# Patient Record
Sex: Male | Born: 1960 | Race: White | Hispanic: No | Marital: Married | Smoking: Never smoker
Health system: Southern US, Community
[De-identification: ages and names within clinical notes are randomized; demographics above are authoritative.]

## PROBLEM LIST (undated history)

## (undated) DIAGNOSIS — K219 Gastro-esophageal reflux disease without esophagitis: Secondary | ICD-10-CM

## (undated) HISTORY — PX: OTHER SURGICAL HISTORY: SHX169

## (undated) HISTORY — PX: HAND SURGERY: SHX662

## (undated) HISTORY — PX: TONSILLECTOMY: SUR1361

---

## 2002-08-04 ENCOUNTER — Ambulatory Visit (HOSPITAL_BASED_OUTPATIENT_CLINIC_OR_DEPARTMENT_OTHER): Admission: RE | Admit: 2002-08-04 | Discharge: 2002-08-04 | Payer: Self-pay | Admitting: Orthopedic Surgery

## 2017-02-13 ENCOUNTER — Emergency Department (HOSPITAL_BASED_OUTPATIENT_CLINIC_OR_DEPARTMENT_OTHER): Payer: Worker's Compensation

## 2017-02-13 ENCOUNTER — Emergency Department (HOSPITAL_BASED_OUTPATIENT_CLINIC_OR_DEPARTMENT_OTHER)
Admission: EM | Admit: 2017-02-13 | Discharge: 2017-02-13 | Disposition: A | Discharge status: Home / Self Care | Payer: Worker's Compensation | Attending: Physician Assistant | Admitting: Physician Assistant

## 2017-02-13 ENCOUNTER — Encounter (HOSPITAL_BASED_OUTPATIENT_CLINIC_OR_DEPARTMENT_OTHER): Payer: Self-pay | Admitting: *Deleted

## 2017-02-13 DIAGNOSIS — S61219A Laceration without foreign body of unspecified finger without damage to nail, initial encounter: Secondary | ICD-10-CM

## 2017-02-13 DIAGNOSIS — S61321A Laceration with foreign body of left index finger with damage to nail, initial encounter: Secondary | ICD-10-CM | POA: Diagnosis not present

## 2017-02-13 DIAGNOSIS — S61213A Laceration without foreign body of left middle finger without damage to nail, initial encounter: Secondary | ICD-10-CM | POA: Diagnosis not present

## 2017-02-13 DIAGNOSIS — S61211A Laceration without foreign body of left index finger without damage to nail, initial encounter: Secondary | ICD-10-CM

## 2017-02-13 DIAGNOSIS — S6992XA Unspecified injury of left wrist, hand and finger(s), initial encounter: Secondary | ICD-10-CM | POA: Diagnosis present

## 2017-02-13 DIAGNOSIS — W268XXA Contact with other sharp object(s), not elsewhere classified, initial encounter: Secondary | ICD-10-CM | POA: Insufficient documentation

## 2017-02-13 DIAGNOSIS — IMO0002 Reserved for concepts with insufficient information to code with codable children: Secondary | ICD-10-CM

## 2017-02-13 DIAGNOSIS — Y929 Unspecified place or not applicable: Secondary | ICD-10-CM | POA: Diagnosis not present

## 2017-02-13 DIAGNOSIS — Y9389 Activity, other specified: Secondary | ICD-10-CM | POA: Diagnosis not present

## 2017-02-13 DIAGNOSIS — Y999 Unspecified external cause status: Secondary | ICD-10-CM | POA: Diagnosis not present

## 2017-02-13 MED ORDER — LIDOCAINE HCL 2 % IJ SOLN
20.0000 mL | Freq: Once | INTRAMUSCULAR | Status: AC
  Administered 2017-02-13: 400 mg
  Filled 2017-02-13: qty 20

## 2017-02-13 NOTE — ED Provider Notes (Signed)
[facility] Provider Note   CSN: [id number] Arrival date & time: 02/13/17  1124     History   Chief Complaint Chief Complaint  Patient presents with  . Finger Injury    HPI Billy Olson is a 56 y.o. male.  HPI Patient presents to the emergency department with laceration to second, third digit on the left hand.  The patient states he was removing a piece of metal flashing when he cut across both fingers.  She states that he is having difficulty with movements of the fingers at this point.  Patient states that he applied pressure to control the bleeding.  He did not take any medications prior to arrival.  Patient denies any weakness, nausea, vomiting, or other injuries History reviewed. No pertinent past medical history.  There are no active problems to display for this patient.   Past Surgical History:  Procedure Laterality Date  . thumb surgery    . TONSILLECTOMY         Home Medications    Prior to Admission medications   Not on File    Family History No family history on file.  Social History Social History  Substance Use Topics  . Smoking status: Never Smoker  . Smokeless tobacco: Never Used  . Alcohol use Yes     Allergies   Patient has no known allergies.   Review of Systems Review of Systems  All other systems negative except as documented in the HPI. All pertinent positives and negatives as reviewed in the HPI. Physical Exam Updated Vital Signs BP 108/72 (BP Location: Right Arm)   Pulse 75   Temp 98.1 F (36.7 C) (Oral)   Resp 18   Ht 6\' 2"  (1.88 m)   Wt 108 kg (238 lb)   SpO2 99%   BMI 30.56 kg/m   Physical Exam  Constitutional: He is oriented to person, place, and time. He appears well-developed and well-nourished. No distress.  HENT:  Head: Normocephalic and atraumatic.  Eyes: Pupils are equal, round, and reactive to light.  Pulmonary/Chest: Effort normal.  Musculoskeletal:       Left hand: He exhibits  laceration. He exhibits normal two-point discrimination, normal capillary refill and no deformity. Normal sensation noted.       Hands: Neurological: He is alert and oriented to person, place, and time.  Skin: Skin is warm and dry.  Psychiatric: He has a normal mood and affect.  Nursing note and vitals reviewed.    ED Treatments / Results  Labs (all labs ordered are listed, but only abnormal results are displayed) Labs Reviewed - No data to display  EKG  EKG Interpretation None       Radiology Dg Hand Complete Left  Result Date: 02/13/2017 CLINICAL DATA:  Laceration of left middle and ring fingers. EXAM: LEFT HAND - COMPLETE 3+ VIEW COMPARISON:  None. FINDINGS: There is no evidence of fracture or dislocation. There is no evidence of arthropathy or other focal bone abnormality. Soft tissues are unremarkable. No radiopaque foreign body is noted. IMPRESSION: Normal left hand. Electronically Signed   By: Lupita RaiderJames  Green Jr, M.D.   On: 02/13/2017 12:23    Procedures Procedures (including critical care time)  Medications Ordered in ED Medications  lidocaine (XYLOCAINE) 2 % (with pres) injection 400 mg (400 mg Infiltration Given 02/13/17 1343)     Initial Impression / Assessment and Plan / ED Course  I have reviewed the triage vital signs and the nursing notes.  Pertinent  labs & imaging results that were available during my care of the patient were reviewed by me and considered in my medical decision making (see chart for details).    I spoke with Dr. Merlyn Lot, of hand surgery, who advised me to loosely approximate the wounds splint and have him follow-up in their office tomorrow.  Patient agrees the plan and all questions were answered.  I advised him  to call his office as soon as he leaves this emergency department, to set up an appointment   LACERATION REPAIR Performed by: Carlyle Dolly Authorized by: Carlyle Dolly Consent: Verbal consent obtained. Risks and  benefits: risks, benefits and alternatives were discussed Consent given by: patient Patient identity confirmed: provided demographic data Prepped and Draped in normal sterile fashion Wound explored  Laceration Location: Second, third digit just above the PIP joints  Laceration Length: Second digit 2 cm third digit 1.5 cm  No Foreign Bodies seen or palpated  Anesthesia: local infiltration  Local anesthetic: lidocaine 2 % without epinephrine  Anesthetic total: 3 ml per wound   Irrigation method: syringe Amount of cleaning: standard  Skin closure: 4-0 Prolene   Number of sutures: 2 sutures in each wound   Technique: Simple interrupted   Patient tolerance: Patient tolerated the procedure well with no immediate complications.  The wound was copiously irrigated  Final Clinical Impressions(s) / ED Diagnoses   Final diagnoses:  Laceration of finger    New Prescriptions New Prescriptions   No medications on file     Charlestine Night, Cordelia Poche 02/14/17 1617    Abelino Derrick, MD 02/15/17 [phone]

## 2017-02-13 NOTE — ED Triage Notes (Signed)
W/C injury. Laceration to his left 2nd and 3rd digits on a piece of metal siding this am. Decreased resistance during neuro fx.

## 2017-02-13 NOTE — ED Notes (Addendum)
W2 lacerationa noted to left hand 1st and 2nd digits. Irrigated and cleaned  - patient tolerated well

## 2017-02-13 NOTE — Discharge Instructions (Signed)
Call the hand surgeon's office for an appointment for tomorrow

## 2017-02-14 ENCOUNTER — Other Ambulatory Visit: Payer: Self-pay | Admitting: Orthopedic Surgery

## 2017-02-14 ENCOUNTER — Ambulatory Visit (HOSPITAL_BASED_OUTPATIENT_CLINIC_OR_DEPARTMENT_OTHER)
Admission: RE | Admit: 2017-02-14 | Discharge: 2017-02-14 | Disposition: A | Discharge status: Home / Self Care | Payer: Worker's Compensation | Source: Ambulatory Visit | Attending: Orthopedic Surgery | Admitting: Orthopedic Surgery

## 2017-02-14 ENCOUNTER — Ambulatory Visit (HOSPITAL_BASED_OUTPATIENT_CLINIC_OR_DEPARTMENT_OTHER): Payer: Worker's Compensation | Admitting: Anesthesiology

## 2017-02-14 ENCOUNTER — Encounter (HOSPITAL_COMMUNITY)
Admission: RE | Disposition: A | Discharge status: Home / Self Care | Payer: Self-pay | Source: Ambulatory Visit | Attending: Orthopedic Surgery

## 2017-02-14 ENCOUNTER — Encounter (HOSPITAL_BASED_OUTPATIENT_CLINIC_OR_DEPARTMENT_OTHER): Payer: Self-pay | Admitting: *Deleted

## 2017-02-14 DIAGNOSIS — Y9389 Activity, other specified: Secondary | ICD-10-CM | POA: Insufficient documentation

## 2017-02-14 DIAGNOSIS — Y9289 Other specified places as the place of occurrence of the external cause: Secondary | ICD-10-CM | POA: Diagnosis not present

## 2017-02-14 DIAGNOSIS — S66121A Laceration of flexor muscle, fascia and tendon of left index finger at wrist and hand level, initial encounter: Secondary | ICD-10-CM | POA: Insufficient documentation

## 2017-02-14 DIAGNOSIS — K219 Gastro-esophageal reflux disease without esophagitis: Secondary | ICD-10-CM | POA: Insufficient documentation

## 2017-02-14 DIAGNOSIS — Z79899 Other long term (current) drug therapy: Secondary | ICD-10-CM | POA: Diagnosis not present

## 2017-02-14 DIAGNOSIS — S66123A Laceration of flexor muscle, fascia and tendon of left middle finger at wrist and hand level, initial encounter: Secondary | ICD-10-CM | POA: Diagnosis not present

## 2017-02-14 DIAGNOSIS — S61211A Laceration without foreign body of left index finger without damage to nail, initial encounter: Secondary | ICD-10-CM | POA: Insufficient documentation

## 2017-02-14 DIAGNOSIS — S61213A Laceration without foreign body of left middle finger without damage to nail, initial encounter: Secondary | ICD-10-CM | POA: Insufficient documentation

## 2017-02-14 DIAGNOSIS — Y990 Civilian activity done for income or pay: Secondary | ICD-10-CM | POA: Insufficient documentation

## 2017-02-14 DIAGNOSIS — W268XXA Contact with other sharp object(s), not elsewhere classified, initial encounter: Secondary | ICD-10-CM | POA: Insufficient documentation

## 2017-02-14 HISTORY — DX: Gastro-esophageal reflux disease without esophagitis: K21.9

## 2017-02-14 HISTORY — PX: TENDON REPAIR: SHX5111

## 2017-02-14 SURGERY — NERVE, TENDON AND ARTERY REPAIR
Anesthesia: Choice | Laterality: Left

## 2017-02-14 SURGERY — TENDON REPAIR
Anesthesia: General | Site: Hand | Laterality: Left

## 2017-02-14 MED ORDER — CHLORHEXIDINE GLUCONATE 4 % EX LIQD: 60.0000 mL | Freq: Once | CUTANEOUS | Status: DC

## 2017-02-14 MED ORDER — HEPARIN SODIUM (PORCINE) 1000 UNIT/ML IJ SOLN
INTRAMUSCULAR | Status: AC
  Filled 2017-02-14: qty 1

## 2017-02-14 MED ORDER — LACTATED RINGERS IV SOLN: INTRAVENOUS | Status: DC

## 2017-02-14 MED ORDER — FENTANYL CITRATE (PF) 100 MCG/2ML IJ SOLN: 25.0000 ug | INTRAMUSCULAR | Status: DC | PRN

## 2017-02-14 MED ORDER — FENTANYL CITRATE (PF) 100 MCG/2ML IJ SOLN
INTRAMUSCULAR | Status: AC
  Filled 2017-02-14: qty 2

## 2017-02-14 MED ORDER — BUPIVACAINE HCL (PF) 0.25 % IJ SOLN
INTRAMUSCULAR | Status: AC
  Filled 2017-02-14: qty 30

## 2017-02-14 MED ORDER — MEPERIDINE HCL 25 MG/ML IJ SOLN: 6.2500 mg | INTRAMUSCULAR | Status: DC | PRN

## 2017-02-14 MED ORDER — HYDROCODONE-ACETAMINOPHEN 7.5-325 MG PO TABS: 1.0000 | ORAL_TABLET | Freq: Once | ORAL | Status: DC | PRN

## 2017-02-14 MED ORDER — CEFAZOLIN SODIUM-DEXTROSE 2-4 GM/100ML-% IV SOLN
INTRAVENOUS | Status: AC
  Filled 2017-02-14: qty 100

## 2017-02-14 MED ORDER — KETOROLAC TROMETHAMINE 30 MG/ML IJ SOLN
INTRAMUSCULAR | Status: DC | PRN
  Administered 2017-02-14: 30 mg via INTRAVENOUS

## 2017-02-14 MED ORDER — LIDOCAINE 2% (20 MG/ML) 5 ML SYRINGE
INTRAMUSCULAR | Status: AC
  Filled 2017-02-14: qty 5

## 2017-02-14 MED ORDER — MIDAZOLAM HCL 2 MG/2ML IJ SOLN
INTRAMUSCULAR | Status: AC
  Filled 2017-02-14: qty 2

## 2017-02-14 MED ORDER — PROPOFOL 10 MG/ML IV BOLUS
INTRAVENOUS | Status: DC | PRN
  Administered 2017-02-14: 200 mg via INTRAVENOUS

## 2017-02-14 MED ORDER — MIDAZOLAM HCL 5 MG/5ML IJ SOLN
INTRAMUSCULAR | Status: DC | PRN
  Administered 2017-02-14: 2 mg via INTRAVENOUS

## 2017-02-14 MED ORDER — LACTATED RINGERS IV SOLN
INTRAVENOUS | Status: DC
  Administered 2017-02-14: 15:00:00 via INTRAVENOUS

## 2017-02-14 MED ORDER — LIDOCAINE 2% (20 MG/ML) 5 ML SYRINGE
INTRAMUSCULAR | Status: DC | PRN
  Administered 2017-02-14: 40 mg via INTRAVENOUS

## 2017-02-14 MED ORDER — SCOPOLAMINE 1 MG/3DAYS TD PT72: 1.0000 | MEDICATED_PATCH | Freq: Once | TRANSDERMAL | Status: DC | PRN

## 2017-02-14 MED ORDER — DEXAMETHASONE SODIUM PHOSPHATE 10 MG/ML IJ SOLN
INTRAMUSCULAR | Status: DC | PRN
  Administered 2017-02-14: 10 mg via INTRAVENOUS

## 2017-02-14 MED ORDER — HYDROCODONE-ACETAMINOPHEN 5-325 MG PO TABS
ORAL_TABLET | ORAL | 0 refills | Status: AC
Start: 2017-02-14 — End: ?

## 2017-02-14 MED ORDER — BUPIVACAINE HCL (PF) 0.25 % IJ SOLN
INTRAMUSCULAR | Status: DC | PRN
  Administered 2017-02-14: 10 mL

## 2017-02-14 MED ORDER — KETOROLAC TROMETHAMINE 30 MG/ML IJ SOLN
INTRAMUSCULAR | Status: AC
  Filled 2017-02-14: qty 1

## 2017-02-14 MED ORDER — DEXAMETHASONE SODIUM PHOSPHATE 10 MG/ML IJ SOLN
INTRAMUSCULAR | Status: AC
  Filled 2017-02-14: qty 1

## 2017-02-14 MED ORDER — CEFAZOLIN SODIUM-DEXTROSE 2-4 GM/100ML-% IV SOLN
2.0000 g | INTRAVENOUS | Status: AC
  Administered 2017-02-14: 2 g via INTRAVENOUS

## 2017-02-14 MED ORDER — ONDANSETRON HCL 4 MG/2ML IJ SOLN
INTRAMUSCULAR | Status: DC | PRN
  Administered 2017-02-14: 4 mg via INTRAVENOUS

## 2017-02-14 MED ORDER — PROMETHAZINE HCL 25 MG/ML IJ SOLN: 6.2500 mg | INTRAMUSCULAR | Status: DC | PRN

## 2017-02-14 MED ORDER — FENTANYL CITRATE (PF) 100 MCG/2ML IJ SOLN
INTRAMUSCULAR | Status: DC | PRN
  Administered 2017-02-14 (×2): 50 ug via INTRAVENOUS
  Administered 2017-02-14: 100 ug via INTRAVENOUS

## 2017-02-14 MED ORDER — ONDANSETRON HCL 4 MG/2ML IJ SOLN
INTRAMUSCULAR | Status: AC
  Filled 2017-02-14: qty 2

## 2017-02-14 MED ORDER — LIDOCAINE HCL (PF) 1 % IJ SOLN
INTRAMUSCULAR | Status: AC
  Filled 2017-02-14: qty 5

## 2017-02-14 SURGICAL SUPPLY — 60 items
BAG DECANTER FOR FLEXI CONT (MISCELLANEOUS) IMPLANT
BANDAGE ACE 3X5.8 VEL STRL LF (GAUZE/BANDAGES/DRESSINGS) ×4 IMPLANT
BLADE MINI RND TIP GREEN BEAV (BLADE) IMPLANT
BLADE SURG 15 STRL LF DISP TIS (BLADE) ×4 IMPLANT
BLADE SURG 15 STRL SS (BLADE) ×4
BNDG ESMARK 4X9 LF (GAUZE/BANDAGES/DRESSINGS) ×4 IMPLANT
BNDG GAUZE ELAST 4 BULKY (GAUZE/BANDAGES/DRESSINGS) ×4 IMPLANT
BRUSH SCRUB EZ PLAIN DRY (MISCELLANEOUS) IMPLANT
CHLORAPREP W/TINT 26ML (MISCELLANEOUS) ×4 IMPLANT
CORDS BIPOLAR (ELECTRODE) ×4 IMPLANT
COVER BACK TABLE 60X90IN (DRAPES) ×4 IMPLANT
COVER MAYO STAND STRL (DRAPES) ×4 IMPLANT
CUFF TOURNIQUET SINGLE 18IN (TOURNIQUET CUFF) ×4 IMPLANT
DECANTER SPIKE VIAL GLASS SM (MISCELLANEOUS) ×4 IMPLANT
DRAPE EXTREMITY T 121X128X90 (DRAPE) ×4 IMPLANT
DRAPE SURG 17X23 STRL (DRAPES) ×4 IMPLANT
GAUZE SPONGE 4X4 12PLY STRL (GAUZE/BANDAGES/DRESSINGS) ×4 IMPLANT
GAUZE XEROFORM 1X8 LF (GAUZE/BANDAGES/DRESSINGS) ×4 IMPLANT
GLOVE BIO SURGEON STRL SZ7.5 (GLOVE) ×4 IMPLANT
GLOVE BIOGEL M STRL SZ7.5 (GLOVE) ×4 IMPLANT
GLOVE BIOGEL PI IND STRL 8 (GLOVE) ×4 IMPLANT
GLOVE BIOGEL PI IND STRL 8.5 (GLOVE) ×2 IMPLANT
GLOVE BIOGEL PI INDICATOR 8 (GLOVE) ×4
GLOVE BIOGEL PI INDICATOR 8.5 (GLOVE) ×2
GLOVE SURG ORTHO 8.0 STRL STRW (GLOVE) ×4 IMPLANT
GOWN STRL REUS W/ TWL LRG LVL3 (GOWN DISPOSABLE) ×2 IMPLANT
GOWN STRL REUS W/TWL LRG LVL3 (GOWN DISPOSABLE) ×2
GOWN STRL REUS W/TWL XL LVL3 (GOWN DISPOSABLE) ×8 IMPLANT
LOOP VESSEL MAXI BLUE (MISCELLANEOUS) IMPLANT
NDL SAFETY ECLIPSE 18X1.5 (NEEDLE) IMPLANT
NEEDLE HYPO 18GX1.5 SHARP (NEEDLE)
NEEDLE HYPO 25X1 1.5 SAFETY (NEEDLE) ×4 IMPLANT
NS IRRIG 1000ML POUR BTL (IV SOLUTION) ×4 IMPLANT
PACK BASIN DAY SURGERY FS (CUSTOM PROCEDURE TRAY) ×4 IMPLANT
PAD CAST 3X4 CTTN HI CHSV (CAST SUPPLIES) ×2 IMPLANT
PAD CAST 4YDX4 CTTN HI CHSV (CAST SUPPLIES) IMPLANT
PADDING CAST ABS 4INX4YD NS (CAST SUPPLIES) ×2
PADDING CAST ABS COTTON 4X4 ST (CAST SUPPLIES) ×2 IMPLANT
PADDING CAST COTTON 3X4 STRL (CAST SUPPLIES) ×2
PADDING CAST COTTON 4X4 STRL (CAST SUPPLIES)
SLEEVE SCD COMPRESS KNEE MED (MISCELLANEOUS) ×4 IMPLANT
SLING ARM FOAM STRAP LRG (SOFTGOODS) ×4 IMPLANT
SPEAR EYE SURG WECK-CEL (MISCELLANEOUS) ×4 IMPLANT
SPLINT PLASTER CAST XFAST 3X15 (CAST SUPPLIES) ×20 IMPLANT
SPLINT PLASTER XTRA FASTSET 3X (CAST SUPPLIES) ×20
STOCKINETTE 4X48 STRL (DRAPES) ×4 IMPLANT
SUT ETHIBOND 3-0 V-5 (SUTURE) IMPLANT
SUT ETHILON 4 0 PS 2 18 (SUTURE) ×12 IMPLANT
SUT FIBERWIRE 4-0 18 TAPR NDL (SUTURE)
SUT NYLON 9 0 VRM6 (SUTURE) IMPLANT
SUT PROLENE 6 0 P 1 18 (SUTURE) ×4 IMPLANT
SUT SILK 4 0 PS 2 (SUTURE) ×4 IMPLANT
SUT SUPRAMID 4-0 (SUTURE) ×8 IMPLANT
SUT VICRYL 4-0 PS2 18IN ABS (SUTURE) IMPLANT
SUTURE FIBERWR 4-0 18 TAPR NDL (SUTURE) IMPLANT
SYR BULB 3OZ (MISCELLANEOUS) ×4 IMPLANT
SYR CONTROL 10ML LL (SYRINGE) ×4 IMPLANT
TOWEL OR 17X24 6PK STRL BLUE (TOWEL DISPOSABLE) ×8 IMPLANT
TRAY DSU PREP LF (CUSTOM PROCEDURE TRAY) IMPLANT
UNDERPAD 30X30 (UNDERPADS AND DIAPERS) ×4 IMPLANT

## 2017-02-14 NOTE — Op Note (Signed)
I assisted Surgeon(s) and Role:    * Betha LoaKuzma, Kevin, MD - Primary    Cindee Salt* Nura Cahoon, MD - Assisting on the Procedure(s): LEFT INDEX AND LONG FINGER REPAIR, on 02/14/2017.  I provided assistance on this case as follows: repair flexor tendons  and closure of the wounds and application of the splints.  Electronically signed by: Nicki ReaperKUZMA,Reza Crymes R, MD Date: 02/14/2017 Time: 4:38 PM

## 2017-02-14 NOTE — Anesthesia Postprocedure Evaluation (Signed)
Anesthesia Post Note  Patient: Billy Olson  Procedure(s) Performed: Procedure(s) (LRB): LEFT INDEX AND LONG FINGER REPAIR, (Left)     Patient location during evaluation: PACU Anesthesia Type: General Level of consciousness: awake and alert and oriented Pain management: pain level controlled Vital Signs Assessment: post-procedure vital signs reviewed and stable Respiratory status: spontaneous breathing, nonlabored ventilation and respiratory function stable Cardiovascular status: blood pressure returned to baseline and stable Postop Assessment: no signs of nausea or vomiting Anesthetic complications: no    Last Vitals:  Vitals:   02/14/17 1700 02/14/17 1704  BP: 120/72   Pulse: 69 70  Resp: 18 17  Temp:      Last Pain:  Vitals:   02/14/17 1645  TempSrc:   PainSc: 0-No pain                 Daisia Slomski A.

## 2017-02-14 NOTE — Discharge Instructions (Addendum)

## 2017-02-14 NOTE — Op Note (Signed)
[name], [name]                ACCOUNT NO.:  [id number]  MEDICAL RECORD NO.:  [id number]  LOCATION:                                 FACILITY:  PHYSICIAN:  Betha LoaKevin Deloras Reichard, MD             DATE OF BIRTH:  DATE OF PROCEDURE:  02/14/2017 DATE OF DISCHARGE:                              OPERATIVE REPORT   PREOPERATIVE DIAGNOSIS:  Left index and long finger lacerations with flexor tendon laceration, possible nerve and artery laceration.  POSTOPERATIVE DIAGNOSES:  Left index finger FDP laceration in zone 2, left long finger FDP and FDS lacerations in zone 2.  PROCEDURE:   1. Left index finger repair of zone 2 flexor tendon FDP laceration 2. Left long finger repair of zone 2 flexor tendon FDP laceration 3. Left long finger repair of zone 2 flexor tendon FDS laceration  SURGEON:  Betha LoaKevin Kalli Greenfield, MD.  ASSISTANT:  Cindee SaltGary Jerrie Schussler, MD.  ANESTHESIA:  General.  IV FLUIDS:  Per Anesthesia flow sheet.  ESTIMATED BLOOD LOSS:  Minimal.  COMPLICATIONS:  None.  SPECIMENS:  None.  TOURNIQUET TIME:  89 minutes.  DISPOSITION:  Stable to PACU.  INDICATIONS:  [name] is a 56 year old left-hand dominant male, who sustained a laceration to his left index and long finger yesterday while at work.  [facility] where the wounds were cleaned and sutured.  He followed up in the office.  He had difficulty with flexion of the fingers and decreased sensation.  I recommended operative exploration with repair of tendon, artery, and nerve as necessary.  Risks, benefits, and alternative surgery were discussed including the risk of blood loss; infection; damage to nerves, vessels, tendons, ligaments, bone; failure of surgery; need for additional surgery; complications with wound healing; continued pain; and stiffness.  He voiced understanding of these risks and elected to proceed.  OPERATIVE COURSE:  After being identified preoperatively by myself, the patient and I agreed upon  procedure and site of procedure.  Surgical site was marked.  The risks, benefits, and alternatives of the surgery were reviewed and he wished to proceed.  Surgical consent had been signed.  He was given IV Ancef as preoperative antibiotic prophylaxis. He was transported to the operating room and placed on the operating room table in supine position with the left upper extremity on arm board.  General anesthesia was induced by anesthesiologist.  The left upper extremity was prepped and draped in a normal sterile orthopedic fashion.  Surgical pause was performed between surgeons, Anesthesia, and operating room staff and all were in agreement as to the patient, procedure, and site of procedure.  The tourniquet at the proximal aspect of the extremity was inflated to 250 mmHg after exsanguination of the limb with an Esmarch bandage.  The sutures had been removed from his traumatic wounds.  These were both extended proximally and distally in a Brunner fashion.  In the long finger, there was a laceration to the FDP tendon 100%.  The radial arm of the FDS was lacerated and there was a small partial laceration to the ulnar arm of the FDS.  The radial and ulnar digital  nerve and artery were intact.  In the index finger, the radial and ulnar digital nerve and artery were intact.  There was a 100% laceration to the FDP tendon.  The FDS tendon was not lacerated.  The stump of the FDP tendon of the index finger was retrieved from proximally.  The stump was passed back underneath the flexor sheath. The A4 pulley was vented proximally to aid in visualization of the distal stump.  A looped 4-0 Supramid suture was then used in a modified Kessler fashion to reapproximate the tendon ends.  This was augmented with a running epitendinous 6-0 Prolene suture.  Good reapproximation was obtained.  In the long finger, the radial arm of the FDS tendon was repaired with the looped Supramid suture.  This provided  good reapproximation.  The FDP tendon was retrieved from proximally.  This was passed back through the sheath.  The A3 pulley was released for visualization.  The looped Supramid suture was again used in a modified Kessler technique to reapproximate the tendon ends.  This was augmented with a running 6-0 epitendinous Prolene suture.  Good reapproximation was obtained.  The tendons were able to glide under the sheath nicely. The wounds were copiously irrigated with sterile saline.  They were closed with 4-0 nylon in a horizontal mattress fashion.  Digital blocks were performed with 10 mL of 0.25% plain Marcaine to aid in postoperative analgesia.  The wounds were dressed with sterile Xeroform and 4x4s and wrapped with a Kerlix bandage.  A dorsal blocking splint was placed and wrapped with Kerlix and Ace bandage.  The tourniquet was deflated at 89 minutes.  Fingertips were pink with brisk capillary refill after deflation of the tourniquet.  Operative drapes were broken down.  The patient was awoken from anesthesia safely.  He was transferred back to stretcher and taken to PACU in stable condition.  I will see him back in the office in 1 week for postoperative followup. We will give him Norco 5/325 one to two p.o. q.6 hours p.r.n. pain, dispensed #30.     Betha Loa, MD     KK/MEDQ  D:  02/14/2017  T:  02/14/2017  Job:  [id number]

## 2017-02-14 NOTE — Brief Op Note (Signed)
02/14/2017  4:37 PM  PATIENT:  Billy Olson  56 y.o. male  PRE-OPERATIVE DIAGNOSIS:  Left index and long finger injury  POST-OPERATIVE DIAGNOSIS:  Left index and long finger injury  PROCEDURE:  Procedure(s): LEFT INDEX AND LONG FINGER REPAIR, (Left)  SURGEON:  Surgeon(s) and Role:    * Betha LoaKuzma, Monteen Toops, MD - Primary    * Cindee SaltKuzma, Gary, MD - Assisting  PHYSICIAN ASSISTANT:   ASSISTANTS: Cindee SaltGary Dimitris Shanahan, MD   ANESTHESIA:   general  EBL:  Total I/O In: 1400 [I.V.:1400] Out: 5 [Blood:5]  BLOOD ADMINISTERED:none  DRAINS: none   LOCAL MEDICATIONS USED:  MARCAINE     SPECIMEN:  No Specimen  DISPOSITION OF SPECIMEN:  N/A  COUNTS:  YES  TOURNIQUET:   Total Tourniquet Time Documented: Upper Arm (Left) - 89 minutes Total: Upper Arm (Left) - 89 minutes   DICTATION: .Other Dictation: Dictation Number [id number]  PLAN OF CARE: Discharge to home after PACU  PATIENT DISPOSITION:  PACU - hemodynamically stable.

## 2017-02-14 NOTE — Anesthesia Preprocedure Evaluation (Addendum)
Anesthesia Evaluation  Patient identified by MRN, date of birth, ID band Patient awake    Reviewed: Allergy & Precautions, NPO status , Patient's Chart, lab work & pertinent test results  Airway Mallampati: III  TM Distance: >3 FB Neck ROM: Full    Dental no notable dental hx. (+) Teeth Intact   Pulmonary neg pulmonary ROS,    Pulmonary exam normal breath sounds clear to auscultation       Cardiovascular negative cardio ROS Normal cardiovascular exam Rhythm:Regular Rate:Normal     Neuro/Psych negative neurological ROS  negative psych ROS   GI/Hepatic Neg liver ROS, GERD  Medicated and Controlled,  Endo/Other  negative endocrine ROS  Renal/GU negative Renal ROS  negative genitourinary   Musculoskeletal Open wound left index and middle fingers   Abdominal   Peds  Hematology negative hematology ROS (+)   Anesthesia Other Findings   Reproductive/Obstetrics                            Anesthesia Physical Anesthesia Plan  ASA: II  Anesthesia Plan: General   Post-op Pain Management:    Induction: Intravenous  PONV Risk Score and Plan: 3 and Ondansetron, Propofol and Midazolam  Airway Management Planned: LMA  Additional Equipment:   Intra-op Plan:   Post-operative Plan: Extubation in OR  Informed Consent: I have reviewed the patients History and Physical, chart, labs and discussed the procedure including the risks, benefits and alternatives for the proposed anesthesia with the patient or authorized representative who has indicated his/her understanding and acceptance.   Dental advisory given  Plan Discussed with: CRNA, Anesthesiologist and Surgeon  Anesthesia Plan Comments:         Anesthesia Quick Evaluation

## 2017-02-14 NOTE — Transfer of Care (Signed)
Immediate Anesthesia Transfer of Care Note  Patient: Billy Olson  Procedure(s) Performed: Procedure(s): LEFT INDEX AND LONG FINGER REPAIR, (Left)  Patient Location: PACU  Anesthesia Type:General  Level of Consciousness: awake and sedated  Airway & Oxygen Therapy: Patient Spontanous Breathing and Patient connected to face mask oxygen  Post-op Assessment: Report given to RN and Post -op Vital signs reviewed and stable  Post vital signs: Reviewed and stable  Last Vitals:  Vitals:   02/14/17 1230  BP: 125/69  Pulse: (!) 59  Resp: 18  Temp: 36.6 C    Last Pain:  Vitals:   02/14/17 1230  TempSrc: Oral      Patients Stated Pain Goal: 0 (02/14/17 1230)  Complications: No apparent anesthesia complications

## 2017-02-14 NOTE — H&P (Signed)
Billy ObeyMelvin R Olson is an 56 y.o. male.   Chief Complaint: left index and long finger lacerations HPI: 56 yo male states he lacerated left index and long fingers on a metal sign at work yesterday.  [facility].  Followed up in office.  No previous injury to left index or long fingers.  Unable to flex normally.  Allergies: No Known Allergies  Past Medical History:  Diagnosis Date  . GERD (gastroesophageal reflux disease)     Past Surgical History:  Procedure Laterality Date  . HAND SURGERY Right    as a child  . thumb surgery    . TONSILLECTOMY      Family History: History reviewed. No pertinent family history.  Social History:   reports that he has never smoked. He has never used smokeless tobacco. He reports that he drinks about 3.6 oz of alcohol per week . He reports that he does not use drugs.  Medications: Medications Prior to Admission  Medication Sig Dispense Refill  . acetaminophen (TYLENOL) 500 MG tablet Take 1,000 mg by mouth every 6 (six) hours as needed.    Marland Kitchen. omeprazole (PRILOSEC OTC) 20 MG tablet Take 20 mg by mouth daily.      No results found for this or any previous visit (from the past 48 hour(s)).  Dg Hand Complete Left  Result Date: 02/13/2017 CLINICAL DATA:  Laceration of left middle and ring fingers. EXAM: LEFT HAND - COMPLETE 3+ VIEW COMPARISON:  None. FINDINGS: There is no evidence of fracture or dislocation. There is no evidence of arthropathy or other focal bone abnormality. Soft tissues are unremarkable. No radiopaque foreign body is noted. IMPRESSION: Normal left hand. Electronically Signed   By: Lupita RaiderJames  Green Jr, M.D.   On: 02/13/2017 12:23     A comprehensive review of systems was negative.  Blood pressure 125/69, pulse (!) 59, temperature 97.8 F (36.6 C), temperature source Oral, resp. rate 18, height 6\' 2"  (1.88 m), weight 108 kg (238 lb), SpO2 98 %.  General appearance: alert, cooperative and appears stated  age Head: Normocephalic, without obvious abnormality, atraumatic Neck: supple, symmetrical, trachea midline Resp: clear to auscultation bilaterally Cardio: regular rate and rhythm GI: non-tender Extremities: Intact sensation and capillary refill all digits except left index and long which have decreased sensation.  +epl/fpl/io.  Lacerations at volar proximal phalanx index and long finger of left hand. Pulses: 2+ and symmetric Skin: Skin color, texture, turgor normal. No rashes or lesions Neurologic: Grossly normal Incision/Wound:as above  Assessment/Plan Left index and long finger lacerations with tendon laceration and possible nerve/artery laceration.  Recommend exploration in OR with repair of tendon/artery/nerve as necessary.  Risks, benefits, and alternatives of surgery were discussed and the patient agrees with the plan of care.   Lissette Schenk R 02/14/2017, 2:26 PM

## 2017-04-03 ENCOUNTER — Other Ambulatory Visit: Payer: Self-pay | Admitting: Orthopedic Surgery

## 2017-04-03 DIAGNOSIS — S66121D Laceration of flexor muscle, fascia and tendon of left index finger at wrist and hand level, subsequent encounter: Secondary | ICD-10-CM

## 2017-04-03 DIAGNOSIS — S66123D Laceration of flexor muscle, fascia and tendon of left middle finger at wrist and hand level, subsequent encounter: Secondary | ICD-10-CM

## 2017-04-04 ENCOUNTER — Ambulatory Visit
Admission: RE | Admit: 2017-04-04 | Discharge: 2017-04-04 | Disposition: A | Discharge status: Home / Self Care | Payer: Worker's Compensation | Source: Ambulatory Visit | Attending: Orthopedic Surgery | Admitting: Orthopedic Surgery

## 2017-04-04 DIAGNOSIS — S66121D Laceration of flexor muscle, fascia and tendon of left index finger at wrist and hand level, subsequent encounter: Secondary | ICD-10-CM

## 2017-04-04 DIAGNOSIS — S66123D Laceration of flexor muscle, fascia and tendon of left middle finger at wrist and hand level, subsequent encounter: Secondary | ICD-10-CM

## 2018-03-04 IMAGING — CR DG HAND COMPLETE 3+V*L*
3 series · 3 of 3 positions shown · non-contrast
Comparison: None.

CLINICAL DATA: Laceration of left middle and ring fingers.

EXAM:
LEFT HAND - COMPLETE 3+ VIEW

[x hand pa left]
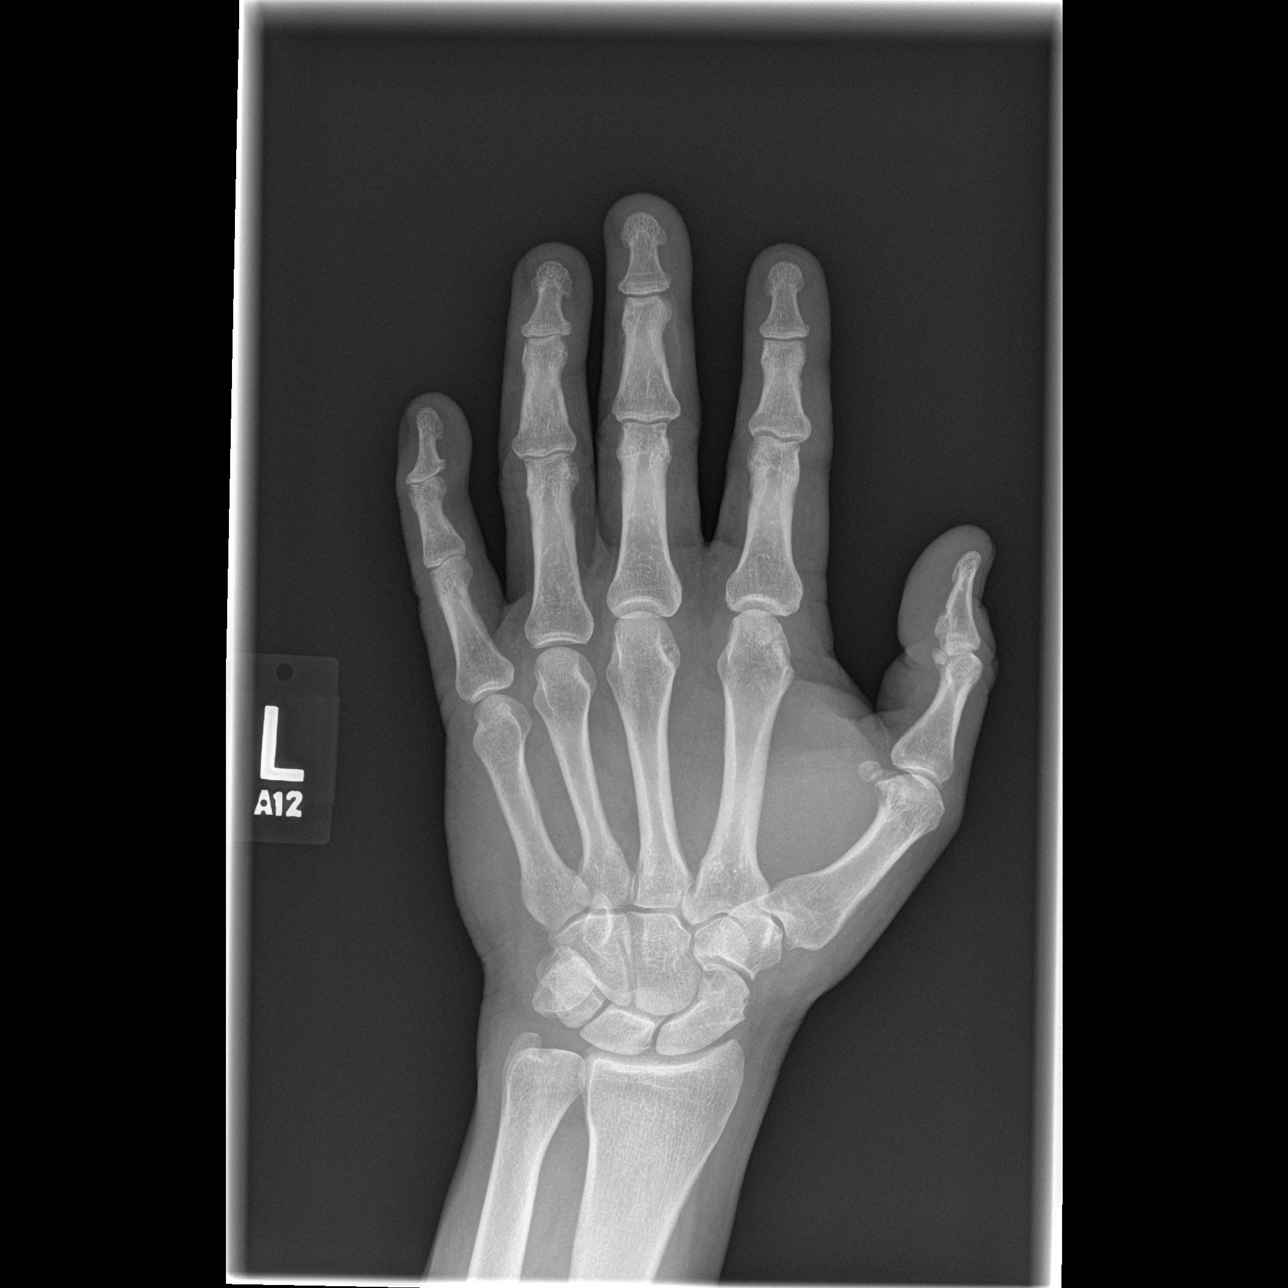

[x hand oblique left]
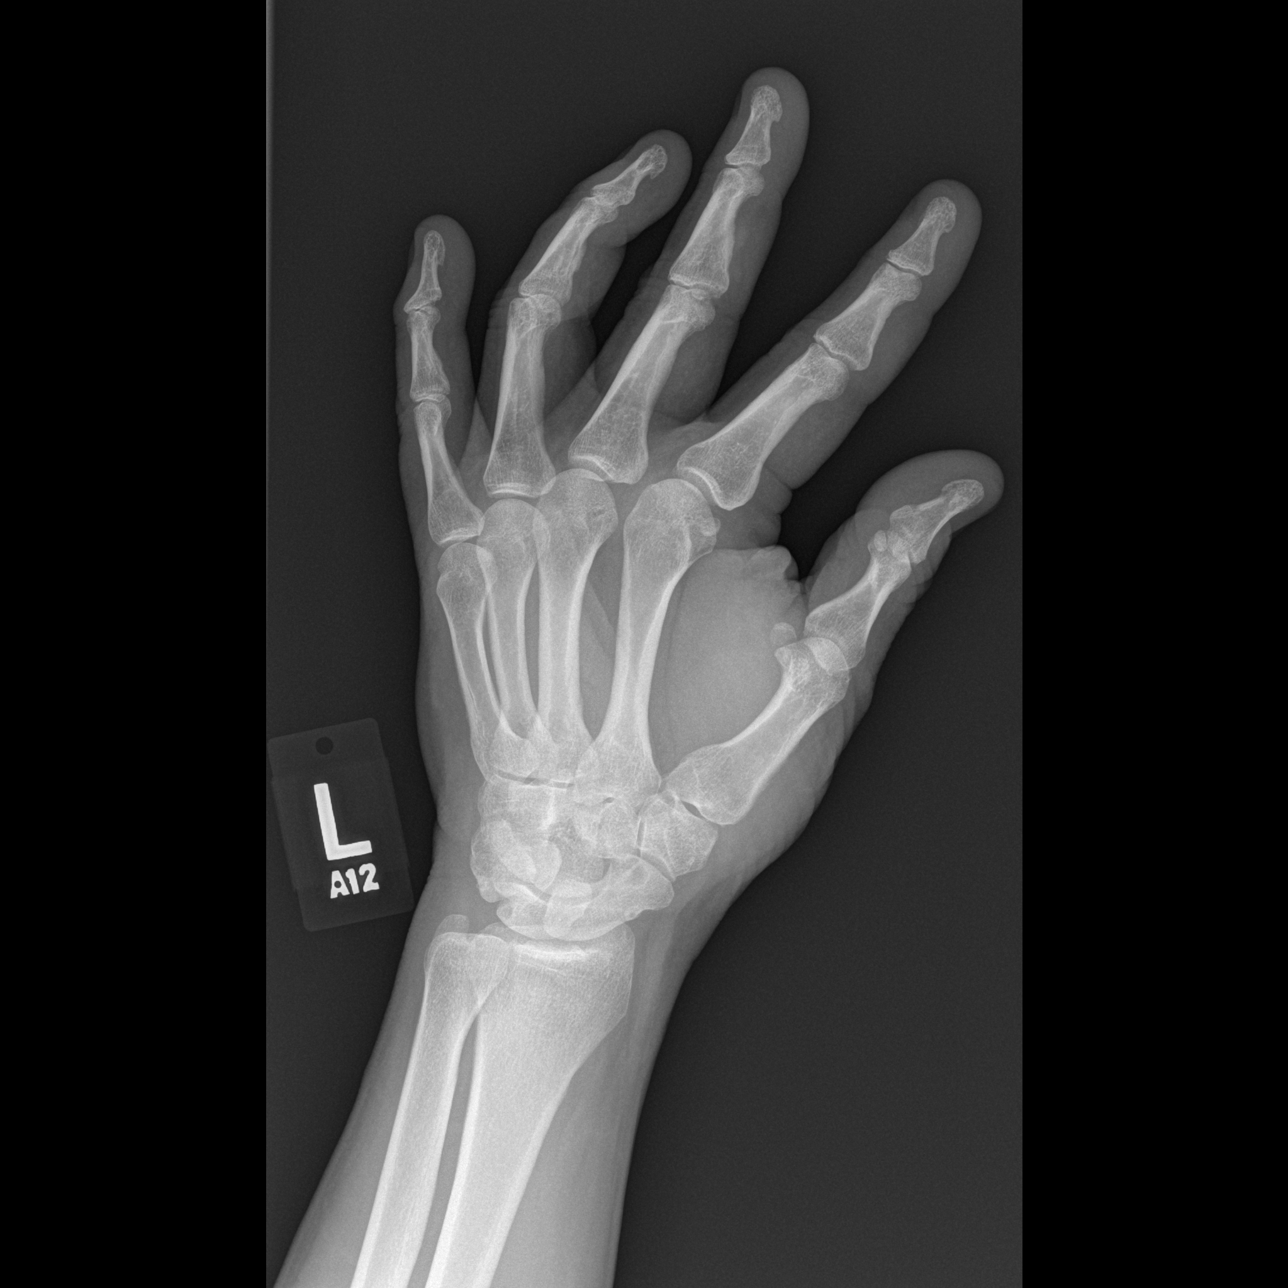

[x hand lat left]
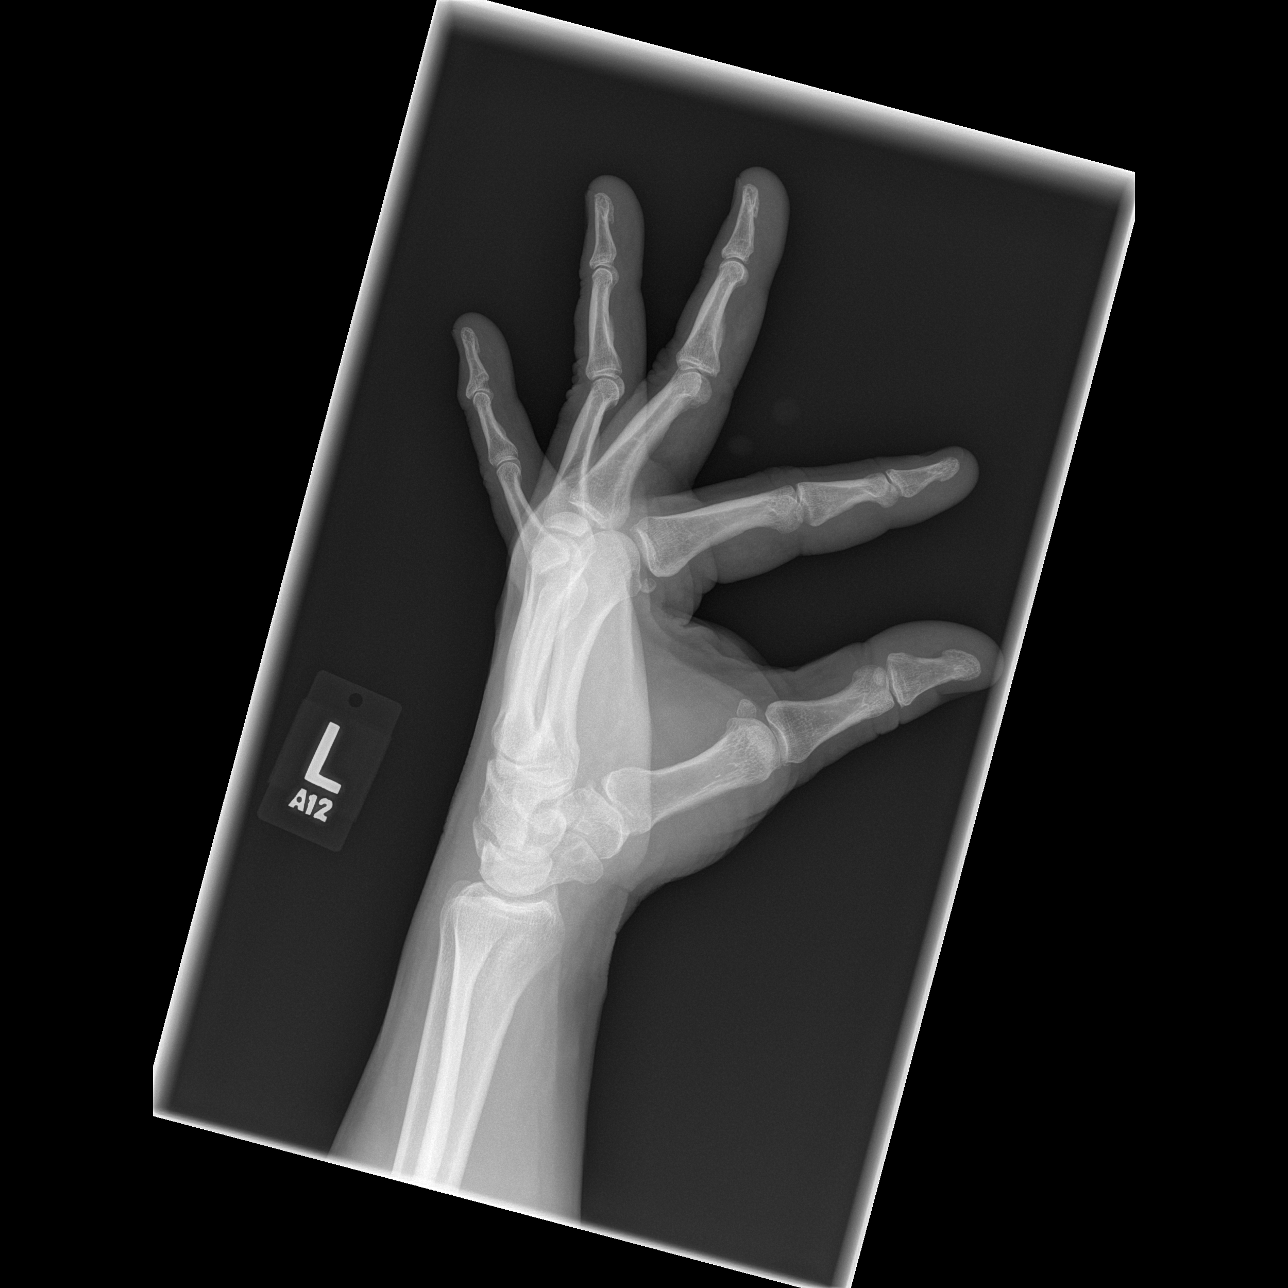

[3 of 3 positions shown; findings below may reference images not displayed]

FINDINGS: There is no evidence of fracture or dislocation. There is no
evidence of arthropathy or other focal bone abnormality. Soft
tissues are unremarkable. No radiopaque foreign body is noted.
IMPRESSION: Normal left hand.

## 2018-04-23 IMAGING — US US EXTREM UP*L* LTD
1 series · 14 of 22 positions shown · non-contrast
Comparison: None

CLINICAL DATA: Laceration of flexor tendon of the left index
finger. Laceration of the flexor tendon of the left little finger.

EXAM:
ULTRASOUND LEFT UPPER EXTREMITY LIMITED
TECHNIQUE: Ultrasound examination of the upper extremity soft tissues was
performed in the area of clinical concern.

[Series 1: us extrem up*left* ltd · 0.05mm/px · 22 acquisitions, 14 frames shown]
[im 1/22]
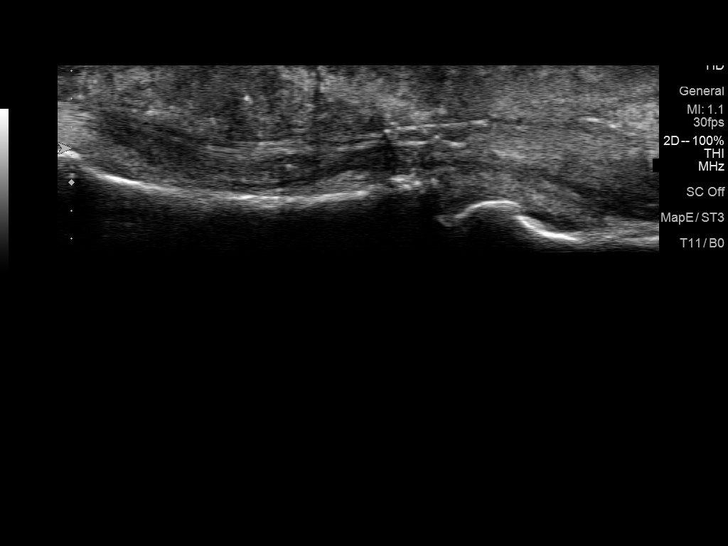
[im 3/22]
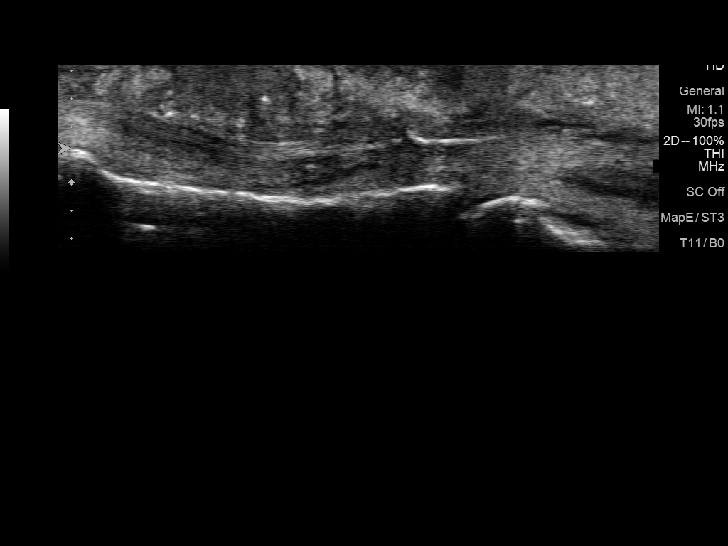
[im 4/22]
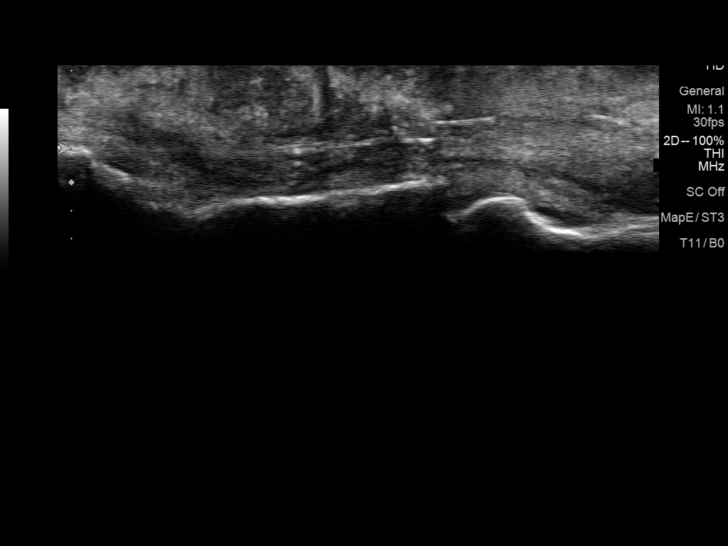
[im 6/22]
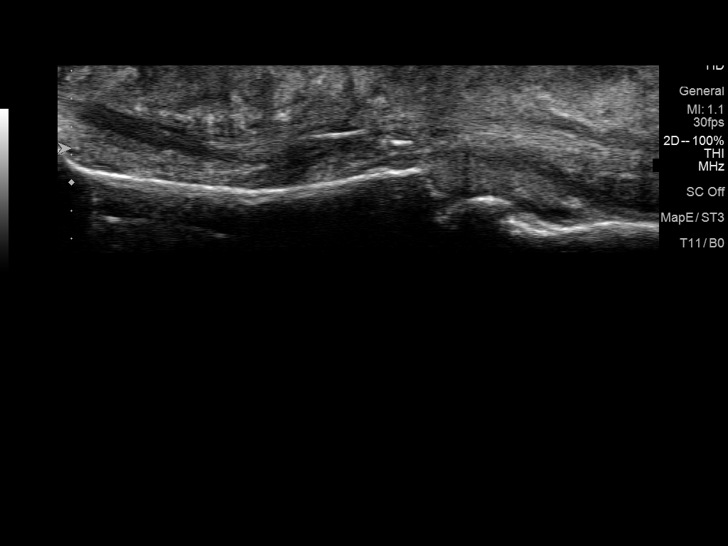
[im 8/22]
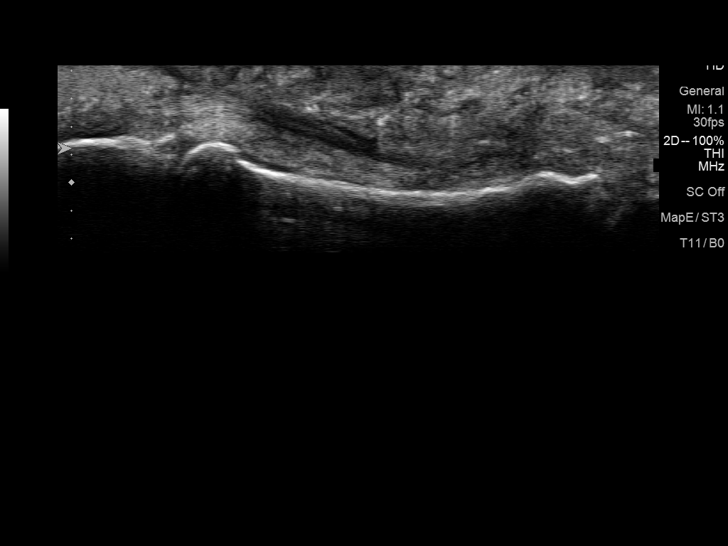
[im 9/22]
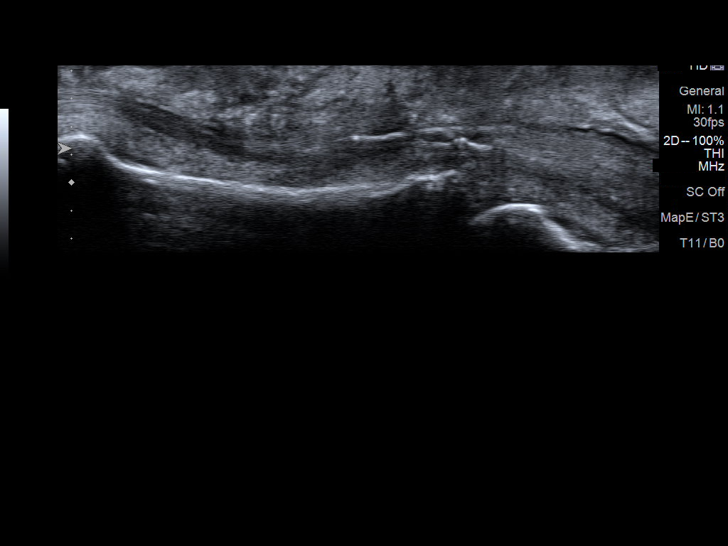
[im 11/22]
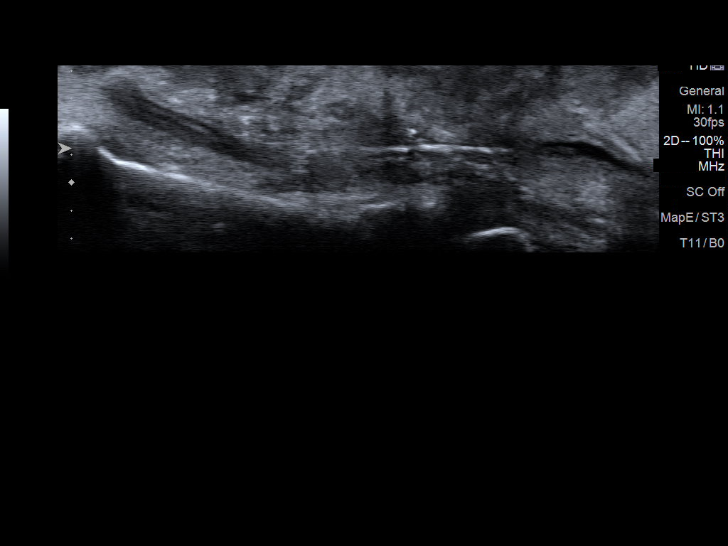
[im 12/22]
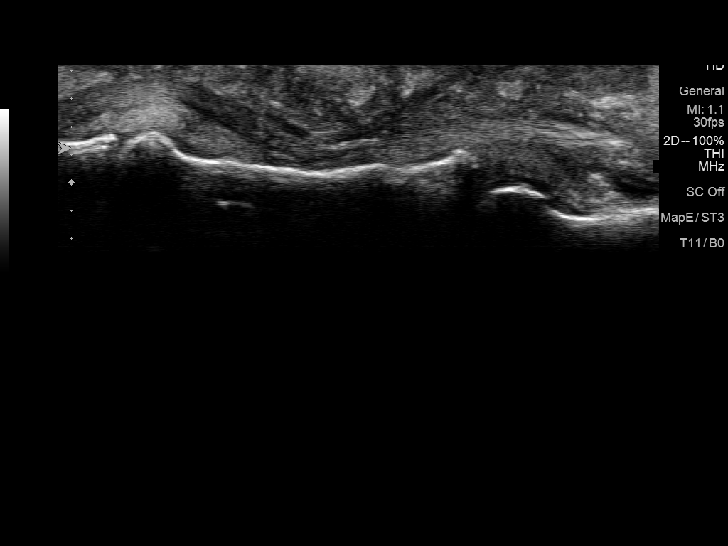
[im 14/22]
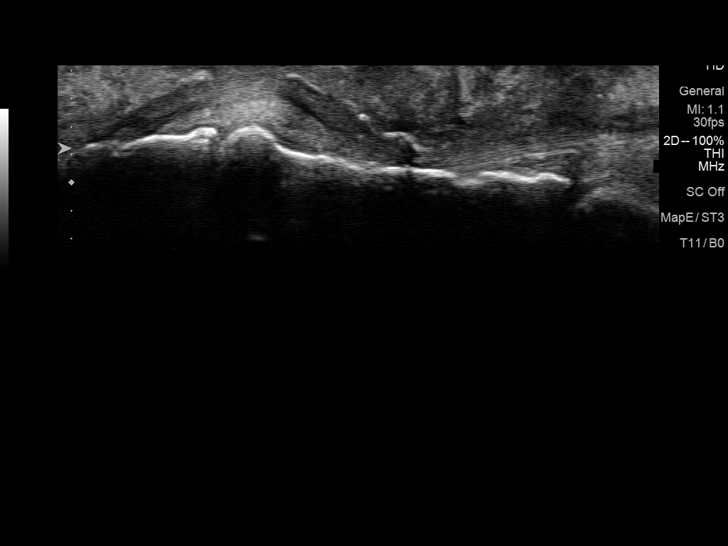
[im 15/22]
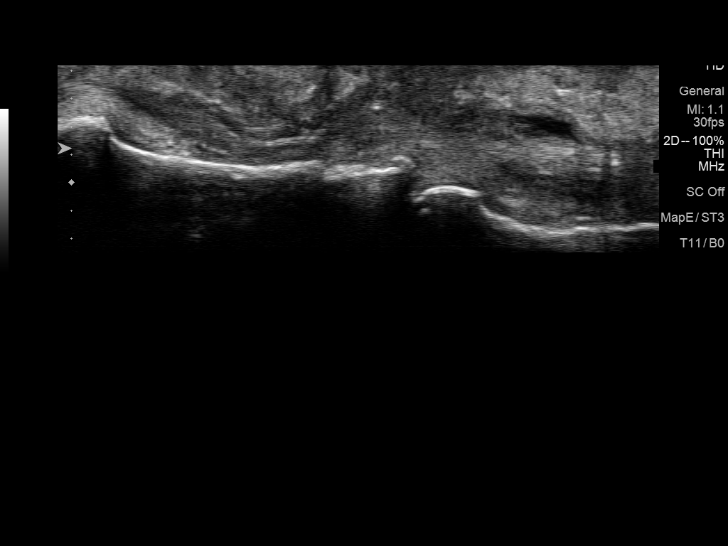
[im 17/22]
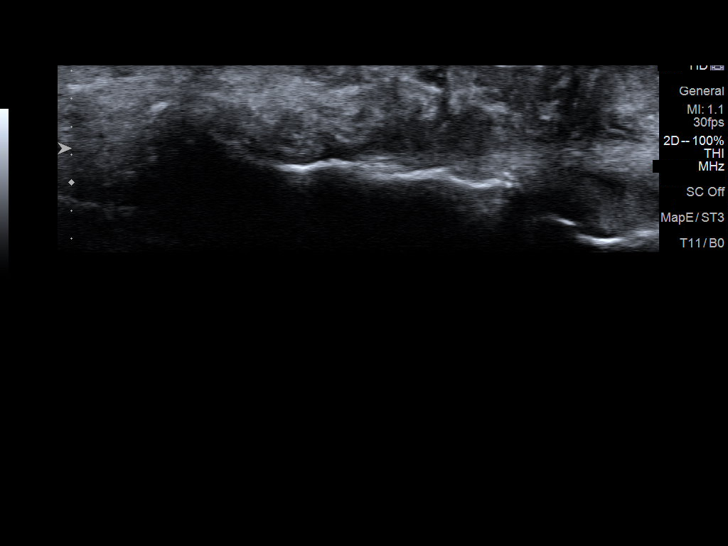
[im 19/22]
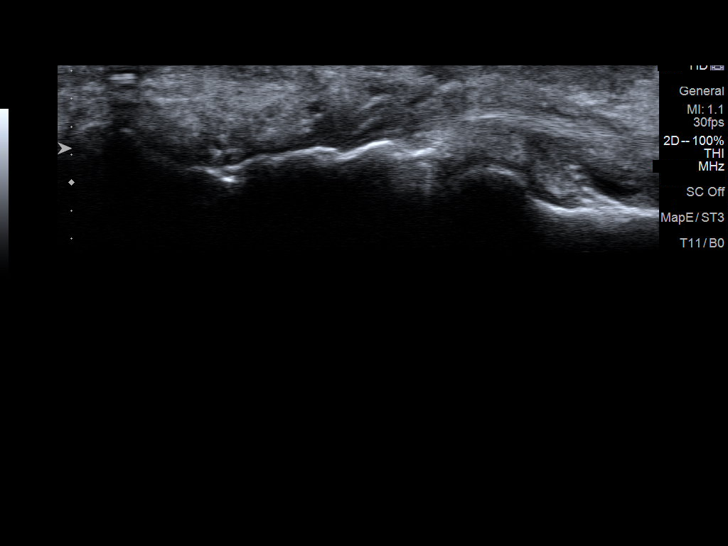
[im 20/22]
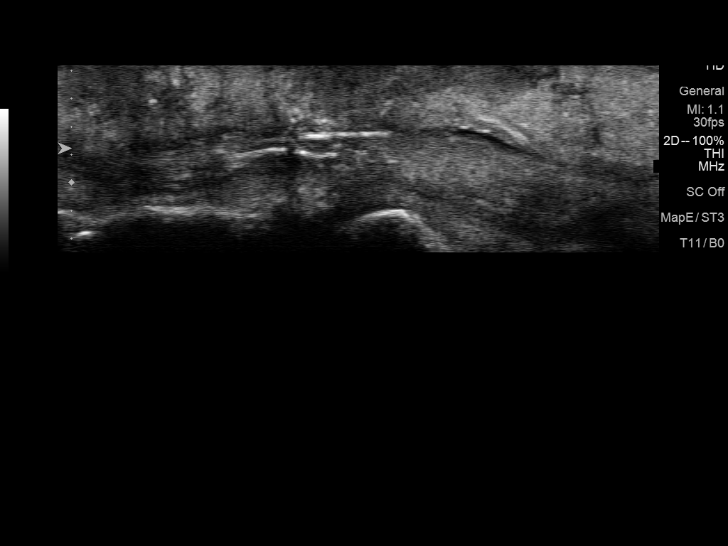
[im 22/22]
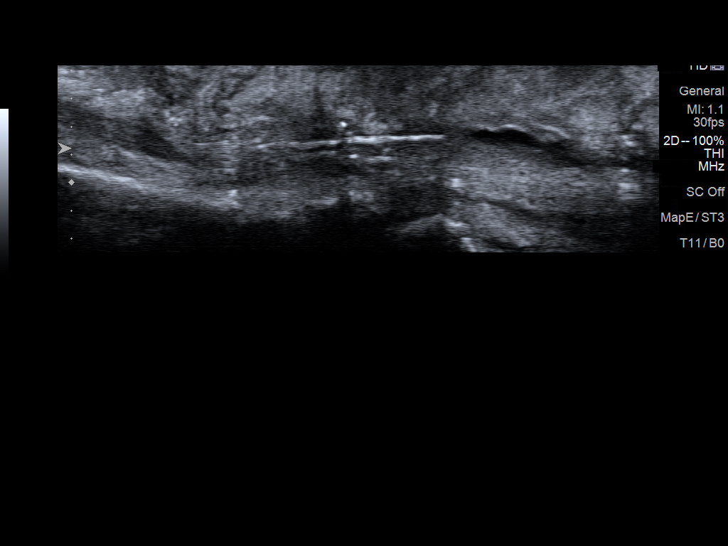

[14 of 22 positions shown; findings below may reference images not displayed]

FINDINGS: Left long finger: The stitches in the flexor digitorum profundus
tendon appear to be intact and the tendon appears to be intact.
However, there is no motion of that tendon with flexion. There is no
visible gap in the tendon.

Left index finger: There is some redundancy of the stitches in the
flexor digitorum profundus in the appears to be is slight gap in the
tendon at the site of the stitches. Distal tendon appears normal.
There is no motion of flexor digitorum profundus with flexion.
IMPRESSION: 1. Intact repaired and flexor digitorum profundus tendon of the left
long finger.
2. Redundancy of the stitches and slight gap in the tendon at the
site of the stitches in the flexor digitorum profundus of the left
index finger.
